# Patient Record
Sex: Female | Born: 1960
Health system: Southern US, Community
[De-identification: ages and names within clinical notes are randomized; demographics above are authoritative.]

## PROBLEM LIST (undated history)

## (undated) ENCOUNTER — Emergency Department (HOSPITAL_COMMUNITY): Discharge status: Against Medical Advice | Payer: Self-pay

## (undated) DIAGNOSIS — Z9289 Personal history of other medical treatment: Secondary | ICD-10-CM

## (undated) DIAGNOSIS — G43909 Migraine, unspecified, not intractable, without status migrainosus: Secondary | ICD-10-CM

## (undated) DIAGNOSIS — I10 Essential (primary) hypertension: Secondary | ICD-10-CM

## (undated) DIAGNOSIS — N390 Urinary tract infection, site not specified: Secondary | ICD-10-CM

## (undated) HISTORY — DX: Urinary tract infection, site not specified: N39.0

## (undated) HISTORY — DX: Essential (primary) hypertension: I10

## (undated) HISTORY — PX: NO PAST SURGERIES: SHX2092

## (undated) HISTORY — DX: Personal history of other medical treatment: Z92.89

## (undated) HISTORY — DX: Migraine, unspecified, not intractable, without status migrainosus: G43.909

---

## 2006-11-02 ENCOUNTER — Emergency Department: Payer: Self-pay | Admitting: Unknown Physician Specialty

## 2010-01-15 ENCOUNTER — Ambulatory Visit: Payer: Self-pay | Admitting: Unknown Physician Specialty

## 2010-12-02 DIAGNOSIS — N390 Urinary tract infection, site not specified: Secondary | ICD-10-CM

## 2010-12-02 HISTORY — DX: Urinary tract infection, site not specified: N39.0

## 2011-01-22 ENCOUNTER — Ambulatory Visit: Payer: Self-pay | Admitting: Unknown Physician Specialty

## 2011-01-22 ENCOUNTER — Ambulatory Visit: Payer: Self-pay | Admitting: Internal Medicine

## 2012-02-25 ENCOUNTER — Ambulatory Visit: Payer: Self-pay | Admitting: Internal Medicine

## 2013-02-22 DIAGNOSIS — Z9289 Personal history of other medical treatment: Secondary | ICD-10-CM

## 2013-02-22 HISTORY — DX: Personal history of other medical treatment: Z92.89

## 2014-04-26 DIAGNOSIS — Z9289 Personal history of other medical treatment: Secondary | ICD-10-CM

## 2014-04-26 HISTORY — DX: Personal history of other medical treatment: Z92.89

## 2015-06-28 ENCOUNTER — Ambulatory Visit
Admission: EM | Admit: 2015-06-28 | Discharge: 2015-06-28 | Disposition: A | Discharge status: Home / Self Care | Payer: BLUE CROSS/BLUE SHIELD | Attending: Family Medicine | Admitting: Family Medicine

## 2015-06-28 ENCOUNTER — Ambulatory Visit: Payer: BLUE CROSS/BLUE SHIELD

## 2015-06-28 DIAGNOSIS — J40 Bronchitis, not specified as acute or chronic: Secondary | ICD-10-CM

## 2015-06-28 DIAGNOSIS — R05 Cough: Secondary | ICD-10-CM | POA: Diagnosis present

## 2015-06-28 MED ORDER — AZITHROMYCIN 250 MG PO TABS: ORAL_TABLET | ORAL | Status: DC

## 2015-06-28 MED ORDER — ALBUTEROL SULFATE HFA 108 (90 BASE) MCG/ACT IN AERS: 1.0000 | INHALATION_SPRAY | Freq: Four times a day (QID) | RESPIRATORY_TRACT | Status: DC | PRN

## 2015-06-28 NOTE — ED Provider Notes (Signed)
SUBJECTIVE:  Diane Bond is a 54 y.o. female who complains of nasal drainage, mild sore throat, nonproductive cough for the last few days. Denies fever, CP, SOB, N/V/D, abdominal pain, severe headache. Has not tried any medication. Denies hx of smoking cigarettes.  Patient denies any history of respiratory or cardiac problems.    OBJECTIVE: She appears well, vital signs are as noted.  General: NAD HEENT: mild pharyngeal erythema, no exudate, no erythema of TMs, no cervical LAD Respiratory: mild expiratory wheezing, slight decreased breath sounds at bases, no accessory muscle use Cardiology: RRR Neurological: CN II-XII grossly intact   ASSESSMENT:  Bronchitis  PLAN: CXR shows no significant acute process/infiltrate, Xopenex treatment given in the office, Claritin prn, Delysm prn, Tylenol/Motrin prn, Z-pk, Albuterol MDI, rest, hydration, seek medical attention if symptoms persist or worsen.        Jolene Provost, MD 06/28/15 1046

## 2015-06-28 NOTE — ED Notes (Signed)
Started last Saturday with runny nose and slight cough. Now worsening cough and hard to get breath when lying on back. Non productive cough. Loss of appetite

## 2017-03-31 ENCOUNTER — Emergency Department
Admission: EM | Admit: 2017-03-31 | Discharge: 2017-03-31 | Disposition: A | Discharge status: Home / Self Care | Payer: BLUE CROSS/BLUE SHIELD | Attending: Emergency Medicine | Admitting: Emergency Medicine

## 2017-03-31 ENCOUNTER — Emergency Department: Payer: BLUE CROSS/BLUE SHIELD

## 2017-03-31 ENCOUNTER — Encounter: Payer: Self-pay | Admitting: Emergency Medicine

## 2017-03-31 DIAGNOSIS — Y999 Unspecified external cause status: Secondary | ICD-10-CM | POA: Insufficient documentation

## 2017-03-31 DIAGNOSIS — Z87891 Personal history of nicotine dependence: Secondary | ICD-10-CM | POA: Diagnosis not present

## 2017-03-31 DIAGNOSIS — M545 Low back pain: Secondary | ICD-10-CM | POA: Diagnosis not present

## 2017-03-31 DIAGNOSIS — M542 Cervicalgia: Secondary | ICD-10-CM | POA: Insufficient documentation

## 2017-03-31 DIAGNOSIS — Y92410 Unspecified street and highway as the place of occurrence of the external cause: Secondary | ICD-10-CM | POA: Diagnosis not present

## 2017-03-31 DIAGNOSIS — Z79899 Other long term (current) drug therapy: Secondary | ICD-10-CM | POA: Diagnosis not present

## 2017-03-31 DIAGNOSIS — Y939 Activity, unspecified: Secondary | ICD-10-CM | POA: Insufficient documentation

## 2017-03-31 DIAGNOSIS — S3992XA Unspecified injury of lower back, initial encounter: Secondary | ICD-10-CM | POA: Diagnosis present

## 2017-03-31 MED ORDER — ORPHENADRINE CITRATE 30 MG/ML IJ SOLN
60.0000 mg | Freq: Two times a day (BID) | INTRAMUSCULAR | Status: DC
  Administered 2017-03-31: 60 mg via INTRAMUSCULAR
  Filled 2017-03-31: qty 2

## 2017-03-31 MED ORDER — OXYCODONE-ACETAMINOPHEN 5-325 MG PO TABS: 1.0000 | ORAL_TABLET | Freq: Four times a day (QID) | ORAL | 0 refills | Status: AC | PRN

## 2017-03-31 MED ORDER — CYCLOBENZAPRINE HCL 5 MG PO TABS: 5.0000 mg | ORAL_TABLET | Freq: Three times a day (TID) | ORAL | 0 refills | Status: AC | PRN
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
## 2017-03-31 NOTE — ED Notes (Signed)
Lower back pain. Neck pain.  Denies numbness or tingling arms or legs. A/o.

## 2017-04-14 ENCOUNTER — Ambulatory Visit: Payer: Self-pay | Admitting: Obstetrics and Gynecology

## 2017-08-18 ENCOUNTER — Ambulatory Visit (INDEPENDENT_AMBULATORY_CARE_PROVIDER_SITE_OTHER): Payer: BLUE CROSS/BLUE SHIELD | Admitting: Obstetrics and Gynecology

## 2017-08-18 ENCOUNTER — Encounter: Payer: Self-pay | Admitting: Obstetrics and Gynecology

## 2017-08-18 VITALS — BP 110/70 | HR 65 | Ht 60.3 in | Wt 110.0 lb

## 2017-08-18 DIAGNOSIS — Z1231 Encounter for screening mammogram for malignant neoplasm of breast: Secondary | ICD-10-CM

## 2017-08-18 DIAGNOSIS — Z1211 Encounter for screening for malignant neoplasm of colon: Secondary | ICD-10-CM | POA: Diagnosis not present

## 2017-08-18 DIAGNOSIS — Z1151 Encounter for screening for human papillomavirus (HPV): Secondary | ICD-10-CM

## 2017-08-18 DIAGNOSIS — Z01419 Encounter for gynecological examination (general) (routine) without abnormal findings: Secondary | ICD-10-CM | POA: Diagnosis not present

## 2017-08-18 DIAGNOSIS — N951 Menopausal and female climacteric states: Secondary | ICD-10-CM

## 2017-08-18 DIAGNOSIS — Z124 Encounter for screening for malignant neoplasm of cervix: Secondary | ICD-10-CM | POA: Diagnosis not present

## 2017-08-18 DIAGNOSIS — Z1239 Encounter for other screening for malignant neoplasm of breast: Secondary | ICD-10-CM

## 2017-08-18 LAB — HEMOCCULT GUIAC POC 1CARD (OFFICE): FECAL OCCULT BLD: NEGATIVE

## 2017-08-18 MED ORDER — ESTRADIOL 0.1 MG/GM VA CREA: 1.0000 | TOPICAL_CREAM | Freq: Every day | VAGINAL | 1 refills | Status: DC

## 2017-08-18 NOTE — Progress Notes (Addendum)
PCP: System, Pcp Not In   Chief Complaint  Patient presents with  . Gynecologic Exam    HPI:      [name] is a 56 y.o. [phone] who LMP was No LMP recorded. Patient is postmenopausal., presents today for her annual examination.  Her menses are absent due to menopause.She does not have intermenstrual bleeding.  She does not have vasomotor sx.   Sex activity: single partner, contraception - post menopausal status. She does have vaginal dryness and has tried lubricants/coconut oil with some relief but still problematic.  Last Pap: February 22, 2013  Results were: no abnormalities /neg HPV DNA.  Hx of STDs: none  Last mammogram: May 22, 2015  Results were: normal--routine follow-up in 12 months There is a FH of breast cancer now in her mom, genetic testing not indicated. There is no FH of ovarian cancer. The patient does do self-breast exams.  Colonoscopy: never; doesn't want right now.  Tobacco use: The patient denies current or previous tobacco use. Alcohol use: none Exercise: moderately active  She does get adequate calcium and Vitamin D in her diet.  Labs with PCP.  Past Medical History:  Diagnosis Date  . Hypertension     History reviewed. No pertinent surgical history.  Family History  Problem Relation Age of Onset  . Diabetes Mother   . Breast cancer Mother 30  . Diabetes Father     Social History   Social History  . Marital status: Married    Spouse name: N/A  . Number of children: N/A  . Years of education: N/A   Occupational History  . Not on file.   Social History Main Topics  . Smoking status: Former Games developer  . Smokeless tobacco: Never Used  . Alcohol use Yes     Comment: socially  . Drug use: No  . Sexual activity: Yes    Partners: Male    Birth control/ protection: None   Other Topics Concern  . Not on file   Social History Narrative  . No narrative on file    No outpatient prescriptions have been marked as taking for the  08/18/17 encounter (Office Visit) with Copland, Alicia B, PA-C.      ROS:  Review of Systems  Constitutional: Positive for unexpected weight change. Negative for fatigue and fever.  Respiratory: Negative for cough, shortness of breath and wheezing.   Cardiovascular: Negative for chest pain, palpitations and leg swelling.  Gastrointestinal: Negative for blood in stool, constipation, diarrhea, nausea and vomiting.  Endocrine: Negative for cold intolerance, heat intolerance and polyuria.  Genitourinary: Positive for dyspareunia. Negative for dysuria, flank pain, frequency, genital sores, hematuria, menstrual problem, pelvic pain, urgency, vaginal bleeding, vaginal discharge and vaginal pain.  Musculoskeletal: Negative for back pain, joint swelling and myalgias.  Skin: Negative for rash.  Neurological: Negative for dizziness, syncope, light-headedness, numbness and headaches.  Hematological: Negative for adenopathy.  Psychiatric/Behavioral: Negative for agitation, confusion, sleep disturbance and suicidal ideas. The patient is not nervous/anxious.      Objective: BP 110/70   Pulse 65   Ht 5' 0.3" (1.532 m)   Wt 110 lb (49.9 kg)   BMI 21.27 kg/m    Physical Exam  Constitutional: She is oriented to person, place, and time. She appears well-developed and well-nourished.  Genitourinary: Rectum normal, vagina normal and uterus normal. There is no rash or tenderness on the right labia. There is no rash or tenderness on the left labia. No erythema or  tenderness in the vagina. No vaginal discharge found. Right adnexum does not display mass and does not display tenderness. Left adnexum does not display mass and does not display tenderness. Cervix does not exhibit motion tenderness or polyp. Uterus is not enlarged or tender. Rectal exam shows no fissure, no mass and guaiac negative stool.  Neck: Normal range of motion. No thyromegaly present.  Cardiovascular: Normal rate, regular rhythm and  normal heart sounds.   No murmur heard. Pulmonary/Chest: Effort normal and breath sounds normal. Right breast exhibits no mass, no nipple discharge, no skin change and no tenderness. Left breast exhibits no mass, no nipple discharge, no skin change and no tenderness.  Abdominal: Soft. There is no tenderness. There is no guarding.  Musculoskeletal: Normal range of motion.  Neurological: She is alert and oriented to person, place, and time. No cranial nerve deficit.  Psychiatric: She has a normal mood and affect. Her behavior is normal.  Vitals reviewed.   Results: Results for orders placed or performed in visit on 08/18/17 (from the past 24 hour(s))  POCT Occult Blood Stool     Status: Normal   Collection Time: 08/18/17  9:45 AM  Result Value Ref Range   Fecal Occult Blood, POC Negative Negative   Card #1 Date     Card #2 Fecal Occult Blod, POC     Card #2 Date     Card #3 Fecal Occult Blood, POC     Card #3 Date      Assessment/Plan:  Encounter for annual routine gynecological examination  Cervical cancer screening - Plan: IGP, Aptima HPV  Screening for HPV (human papillomavirus) - Plan: IGP, Aptima HPV  Screening for breast cancer - Pt to sched mammo. - Plan: MM DIGITAL SCREENING BILATERAL  Screening for colon cancer - Neg FOBT. Recommended scr colonoscopy due to age. Pt to consider and f/u for ref prn. - Plan: POCT Occult Blood Stool  Vaginal dryness, menopausal - Rx estrace crm. Cont lubricants. F/u prn. - Plan: estradiol (ESTRACE) 0.1 MG/GM vaginal cream          GYN counsel breast self exam, mammography screening, menopause, adequate intake of calcium and vitamin D    F/U  Return in about 1 year (around 08/18/2018).  Alicia B. Copland, PA-C 08/18/2017 9:47 AM

## 2017-08-18 NOTE — Patient Instructions (Signed)
[facility]: [phone]  [facility]: [phone] 

## 2017-08-20 LAB — IGP, APTIMA HPV
HPV Aptima: NEGATIVE
PAP Smear Comment: 0

## 2017-09-01 ENCOUNTER — Encounter: Payer: Self-pay | Admitting: Obstetrics and Gynecology

## 2017-09-03 ENCOUNTER — Encounter: Payer: Self-pay | Admitting: Obstetrics and Gynecology
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
## 2018-08-25 ENCOUNTER — Ambulatory Visit: Payer: BLUE CROSS/BLUE SHIELD | Admitting: Obstetrics and Gynecology

## 2018-09-15 ENCOUNTER — Encounter: Payer: Self-pay | Admitting: Obstetrics and Gynecology

## 2018-09-15 ENCOUNTER — Ambulatory Visit (INDEPENDENT_AMBULATORY_CARE_PROVIDER_SITE_OTHER): Payer: BLUE CROSS/BLUE SHIELD | Admitting: Obstetrics and Gynecology

## 2018-09-15 VITALS — BP 124/84 | HR 70 | Ht 63.5 in | Wt 112.0 lb

## 2018-09-15 DIAGNOSIS — Z800 Family history of malignant neoplasm of digestive organs: Secondary | ICD-10-CM | POA: Diagnosis not present

## 2018-09-15 DIAGNOSIS — Z01419 Encounter for gynecological examination (general) (routine) without abnormal findings: Secondary | ICD-10-CM | POA: Diagnosis not present

## 2018-09-15 DIAGNOSIS — Z1211 Encounter for screening for malignant neoplasm of colon: Secondary | ICD-10-CM | POA: Diagnosis not present

## 2018-09-15 DIAGNOSIS — Z1239 Encounter for other screening for malignant neoplasm of breast: Secondary | ICD-10-CM | POA: Diagnosis not present

## 2018-09-15 DIAGNOSIS — N941 Unspecified dyspareunia: Secondary | ICD-10-CM

## 2018-09-15 DIAGNOSIS — N951 Menopausal and female climacteric states: Secondary | ICD-10-CM

## 2018-09-15 MED ORDER — ESTRADIOL 0.1 MG/GM VA CREA: 1.0000 | TOPICAL_CREAM | Freq: Every day | VAGINAL | 1 refills | Status: DC

## 2018-09-15 NOTE — Patient Instructions (Signed)
I value your feedback and entrusting us with your care. If you get a [facility] patient survey, I would appreciate you taking the time to let us know about your experience today. Thank you!  [facility]: [phone]  [facility]: [phone]  

## 2018-09-15 NOTE — Progress Notes (Signed)
PCP: System, Pcp Not In   Chief Complaint  Patient presents with  . Gynecologic Exam    HPI:      [name] is a 57 y.o. [phone] who LMP was No LMP recorded. Patient is postmenopausal., presents today for her annual examination.  Her menses are absent due to menopause.She does not have intermenstrual bleeding.  She does not have vasomotor sx.   Sex activity: single partner, contraception - post menopausal status. She does have vaginal dryness and has tried lubricants/coconut oil with some relief. Started estrace crm last yr with some improvement. Wants to cont.  Last Pap: 08/18/17 Results were: no abnormalities /neg HPV DNA.  Hx of STDs: none  Last mammogram: 09/01/17  Results were: normal--routine follow-up in 12 months There is a FH of breast cancer now in her mom, genetic testing not indicated. There is no FH of ovarian cancer. Her brother was diagnosed this yr with stage 4 colon cancer and is "gene neg". The patient does do self-breast exams.  Colonoscopy: never; declined last yr. Is considering now due to FH colon cancer in her brother. Plans to sched through PCP.  Tobacco use: The patient denies current or previous tobacco use. Alcohol use: social Exercise: moderately active  She does get adequate calcium and Vitamin D in her diet.  Labs with PCP.  Past Medical History:  Diagnosis Date  . History of mammogram 04/26/2014   Birad 1  . History of Papanicolaou smear of cervix 02/22/2013   n/n  . Hypertension    Borderline  . Urinary tract infection 2012    History reviewed. No pertinent surgical history.  Family History  Problem Relation Age of Onset  . Diabetes Mother        DM type 2  . Breast cancer Mother 34  . Diabetes Father        DM type 2  . Other Maternal Grandmother        Uterine Adnexal Neoplasm, Malignant, 40's  . Diabetes Maternal Grandmother        DM Type 2  . Diabetes Paternal Grandmother        DM type 2  . Diabetes Other    DM type 2  . Colon cancer Brother 76       stage 4; "gene neg"    Social History   Socioeconomic History  . Marital status: Married    Spouse name: Not on file  . Number of children: Not on file  . Years of education: Not on file  . Highest education level: Not on file  Occupational History  . Not on file  Social Needs  . Financial resource strain: Not on file  . Food insecurity:    Worry: Not on file    Inability: Not on file  . Transportation needs:    Medical: Not on file    Non-medical: Not on file  Tobacco Use  . Smoking status: Former Games developer  . Smokeless tobacco: Never Used  Substance and Sexual Activity  . Alcohol use: Yes    Comment: socially  . Drug use: No  . Sexual activity: Yes    Partners: Male    Birth control/protection: Post-menopausal  Lifestyle  . Physical activity:    Days per week: Not on file    Minutes per session: Not on file  . Stress: Not on file  Relationships  . Social connections:    Talks on phone: Not on file    Gets  together: Not on file    Attends religious service: Not on file    Active member of club or organization: Not on file    Attends meetings of clubs or organizations: Not on file    Relationship status: Not on file  . Intimate partner violence:    Fear of current or ex partner: Not on file    Emotionally abused: Not on file    Physically abused: Not on file    Forced sexual activity: Not on file  Other Topics Concern  . Not on file  Social History Narrative  . Not on file    No outpatient medications have been marked as taking for the 09/15/18 encounter (Office Visit) with Irem Stoneham, Ilona Sorrel, PA-C.      ROS:  Review of Systems  Constitutional: Negative for fatigue, fever and unexpected weight change.  Respiratory: Negative for cough, shortness of breath and wheezing.   Cardiovascular: Negative for chest pain, palpitations and leg swelling.  Gastrointestinal: Negative for blood in stool, constipation, diarrhea,  nausea and vomiting.  Endocrine: Negative for cold intolerance, heat intolerance and polyuria.  Genitourinary: Negative for dyspareunia, dysuria, flank pain, frequency, genital sores, hematuria, menstrual problem, pelvic pain, urgency, vaginal bleeding, vaginal discharge and vaginal pain.  Musculoskeletal: Positive for arthralgias. Negative for back pain, joint swelling and myalgias.  Skin: Negative for rash.  Neurological: Negative for dizziness, syncope, light-headedness, numbness and headaches.  Hematological: Negative for adenopathy.  Psychiatric/Behavioral: Negative for agitation, confusion, sleep disturbance and suicidal ideas. The patient is not nervous/anxious.      Objective: BP 124/84   Pulse 70   Ht 5' 3.5" (1.613 m)   Wt 112 lb (50.8 kg)   BMI 19.53 kg/m    Physical Exam  Constitutional: She is oriented to person, place, and time. She appears well-developed and well-nourished.  Genitourinary: Rectum normal, vagina normal and uterus normal. There is no rash or tenderness on the right labia. There is no rash or tenderness on the left labia. No erythema or tenderness in the vagina. No vaginal discharge found. Right adnexum does not display mass and does not display tenderness. Left adnexum does not display mass and does not display tenderness. Cervix does not exhibit motion tenderness or polyp. Uterus is not enlarged or tender. Rectal exam shows no fissure, no mass and guaiac negative stool.  Neck: Normal range of motion. No thyromegaly present.  Cardiovascular: Normal rate, regular rhythm and normal heart sounds.  No murmur heard. Pulmonary/Chest: Effort normal and breath sounds normal. Right breast exhibits no mass, no nipple discharge, no skin change and no tenderness. Left breast exhibits no mass, no nipple discharge, no skin change and no tenderness.  Abdominal: Soft. There is no tenderness. There is no guarding.  Musculoskeletal: Normal range of motion.  Neurological: She  is alert and oriented to person, place, and time. No cranial nerve deficit.  Psychiatric: She has a normal mood and affect. Her behavior is normal.  Vitals reviewed.   Assessment/Plan:  Encounter for annual routine gynecological examination  Screening for breast cancer - Pt to sched mammo - Plan: MM 3D SCREEN BREAST BILATERAL  Screening for colon cancer - Recommended scr colonoscopy due to age/FH. Pt wants to sched through PCP.  Family history of colon cancer  Vaginal dryness, menopausal - Rx RF estrace. F/u prn.  - Plan: estradiol (ESTRACE) 0.1 MG/GM vaginal cream  Dyspareunia in female - Plan: estradiol (ESTRACE) 0.1 MG/GM vaginal cream  Meds ordered this encounter  Medications  .  estradiol (ESTRACE) 0.1 MG/GM vaginal cream    Sig: Place 1 Applicatorful vaginally at bedtime. Insert 1g nightly for 1 wk, then 1 g once weekly as maintenace    Dispense:  42.5 g    Refill:  1    Order Specific Question:   Supervising Provider    Answer:   Nadara Mustard [[id number]]           GYN counsel breast self exam, mammography screening, menopause, adequate intake of calcium and vitamin D    F/U  Return in about 1 year (around 09/16/2019).  Ravinder Lukehart B. Kawanna Christley, PA-C 09/15/2018 2:00 PM

## 2018-10-16 ENCOUNTER — Encounter: Payer: Self-pay | Admitting: Obstetrics and Gynecology

## 2018-10-19 ENCOUNTER — Encounter: Payer: Self-pay | Admitting: Obstetrics and Gynecology

## 2020-09-13 ENCOUNTER — Ambulatory Visit: Payer: BLUE CROSS/BLUE SHIELD | Admitting: Obstetrics and Gynecology

## 2020-10-31 ENCOUNTER — Encounter: Payer: Self-pay | Admitting: Obstetrics and Gynecology

## 2020-10-31 ENCOUNTER — Other Ambulatory Visit: Payer: Self-pay

## 2020-10-31 ENCOUNTER — Ambulatory Visit (INDEPENDENT_AMBULATORY_CARE_PROVIDER_SITE_OTHER): Payer: No Typology Code available for payment source | Admitting: Obstetrics and Gynecology

## 2020-10-31 VITALS — BP 140/90 | Ht 63.5 in | Wt 118.0 lb

## 2020-10-31 DIAGNOSIS — Z1231 Encounter for screening mammogram for malignant neoplasm of breast: Secondary | ICD-10-CM

## 2020-10-31 DIAGNOSIS — Z1211 Encounter for screening for malignant neoplasm of colon: Secondary | ICD-10-CM

## 2020-10-31 DIAGNOSIS — Z800 Family history of malignant neoplasm of digestive organs: Secondary | ICD-10-CM

## 2020-10-31 DIAGNOSIS — N941 Unspecified dyspareunia: Secondary | ICD-10-CM

## 2020-10-31 DIAGNOSIS — Z01419 Encounter for gynecological examination (general) (routine) without abnormal findings: Secondary | ICD-10-CM

## 2020-10-31 DIAGNOSIS — N951 Menopausal and female climacteric states: Secondary | ICD-10-CM

## 2020-10-31 MED ORDER — ESTRADIOL 0.1 MG/GM VA CREA: 1.0000 | TOPICAL_CREAM | Freq: Every day | VAGINAL | 1 refills | Status: DC

## 2020-10-31 NOTE — Patient Instructions (Addendum)
I value your feedback and entrusting us with your care. If you get a [place] patient survey, I would appreciate you taking the time to let us know about your experience today. Thank you!  As of November 11, 2019, your lab results will be released to your MyChart immediately, before I even have a chance to see them. Please give me time to review them and contact you if there are any abnormalities. Thank you for your patience.     [facility]: [phone]  

## 2020-10-31 NOTE — Progress Notes (Signed)
PCP: Pcp, No   Chief Complaint  Patient presents with  . Gynecologic Exam    HPI:      [name] is a 59 y.o. [phone] who LMP was No LMP recorded. Patient is postmenopausal., presents today for her annual examination.  Her menses are absent due to menopause.She does not have intermenstrual bleeding.  She does not have vasomotor sx.   Sex activity: single partner, contraception - post menopausal status. She does have vaginal dryness improved with estrace crm. Needs RF.  Last Pap: 08/18/17 Results were: no abnormalities /neg HPV DNA.  Hx of STDs: none  Last [facility]: 10/16/18 [facility]; Results were: normal--routine follow-up in 12 months There is a FH of breast cancer in her mom, genetic testing not indicated. There is no FH of ovarian cancer. Her brother had stage 4 colon cancer and is "gene neg". The patient does do self-breast exams.  Colonoscopy: never; declined in past; has referral through PCP.  Tobacco use: The patient denies current or previous tobacco use. Alcohol use: none No drug use Exercise: min active  She does get adequate calcium but not Vitamin D in her diet.  Labs with PCP.  Past Medical History:  Diagnosis Date  . History of mammogram 04/26/2014   Birad 1  . History of Papanicolaou smear of cervix 02/22/2013   n/n  . Hypertension    Borderline  . Urinary tract infection 2012    History reviewed. No pertinent surgical history.  Family History  Problem Relation Age of Onset  . Diabetes Mother        DM type 2  . Breast cancer Mother 55  . Diabetes Father        DM type 2  . Other Maternal Grandmother        Uterine Adnexal Neoplasm, Malignant, 40's  . Diabetes Maternal Grandmother        DM Type 2  . Diabetes Paternal Grandmother        DM type 2  . Diabetes Other        DM type 2  . Colon cancer Brother 41       stage 4; "gene neg"    Social History   Socioeconomic History  . Marital status: Married    Spouse name: Not on  file  . Number of children: Not on file  . Years of education: Not on file  . Highest education level: Not on file  Occupational History  . Not on file  Tobacco Use  . Smoking status: Former Games developer  . Smokeless tobacco: Never Used  Vaping Use  . Vaping Use: Never used  Substance and Sexual Activity  . Alcohol use: Yes    Comment: socially  . Drug use: No  . Sexual activity: Not Currently    Partners: Male    Birth control/protection: Post-menopausal  Other Topics Concern  . Not on file  Social History Narrative  . Not on file   Social Determinants of Health   Financial Resource Strain:   . Difficulty of Paying Living Expenses: Not on file  Food Insecurity:   . Worried About Programme researcher, broadcasting/film/video in the Last Year: Not on file  . Ran Out of Food in the Last Year: Not on file  Transportation Needs:   . Lack of Transportation (Medical): Not on file  . Lack of Transportation (Non-Medical): Not on file  Physical Activity:   . Days of Exercise per Week: Not on file  .  Minutes of Exercise per Session: Not on file  Stress:   . Feeling of Stress : Not on file  Social Connections:   . Frequency of Communication with Friends and Family: Not on file  . Frequency of Social Gatherings with Friends and Family: Not on file  . Attends Religious Services: Not on file  . Active Member of Clubs or Organizations: Not on file  . Attends Banker Meetings: Not on file  . Marital Status: Not on file  Intimate Partner Violence:   . Fear of Current or Ex-Partner: Not on file  . Emotionally Abused: Not on file  . Physically Abused: Not on file  . Sexually Abused: Not on file    No outpatient medications have been marked as taking for the 10/31/20 encounter (Office Visit) with Jaislyn Blinn, Ilona Sorrel, PA-C.      ROS:  Review of Systems  Constitutional: Negative for fatigue, fever and unexpected weight change.  Respiratory: Negative for cough, shortness of breath and wheezing.     Cardiovascular: Negative for chest pain, palpitations and leg swelling.  Gastrointestinal: Negative for blood in stool, constipation, diarrhea, nausea and vomiting.  Endocrine: Negative for cold intolerance, heat intolerance and polyuria.  Genitourinary: Negative for dyspareunia, dysuria, flank pain, frequency, genital sores, hematuria, menstrual problem, pelvic pain, urgency, vaginal bleeding, vaginal discharge and vaginal pain.  Musculoskeletal: Negative for arthralgias, back pain, joint swelling and myalgias.  Skin: Negative for rash.  Neurological: Negative for dizziness, syncope, light-headedness, numbness and headaches.  Hematological: Negative for adenopathy.  Psychiatric/Behavioral: Negative for agitation, confusion, sleep disturbance and suicidal ideas. The patient is not nervous/anxious.      Objective: BP 140/90   Ht 5' 3.5" (1.613 m)   Wt 118 lb (53.5 kg)   BMI 20.57 kg/m    Physical Exam Constitutional:      Appearance: She is well-developed.  Genitourinary:     Vulva, vagina, uterus, right adnexa, left adnexa and rectum normal.     No vulval lesion or tenderness noted.     No vaginal discharge, erythema or tenderness.     No cervical motion tenderness or polyp.     Uterus is not enlarged or tender.     No right or left adnexal mass present.     Right adnexa not tender.     Left adnexa not tender.  Rectum:     Guaiac result negative.     No rectal mass or anal fissure.  Neck:     Thyroid: No thyromegaly.  Cardiovascular:     Rate and Rhythm: Normal rate and regular rhythm.     Heart sounds: Normal heart sounds. No murmur heard.   Pulmonary:     Effort: Pulmonary effort is normal.     Breath sounds: Normal breath sounds.  Chest:     Breasts:        Right: No mass, nipple discharge, skin change or tenderness.        Left: No mass, nipple discharge, skin change or tenderness.  Abdominal:     Palpations: Abdomen is soft.     Tenderness: There is no  abdominal tenderness. There is no guarding.  Musculoskeletal:        General: Normal range of motion.     Cervical back: Normal range of motion.  Neurological:     General: No focal deficit present.     Mental Status: She is alert and oriented to person, place, and time.     Cranial Nerves:  No cranial nerve deficit.  Skin:    General: Skin is warm and dry.  Psychiatric:        Mood and Affect: Mood normal.        Behavior: Behavior normal.        Thought Content: Thought content normal.        Judgment: Judgment normal.  Vitals reviewed.     Assessment/Plan:  Encounter for annual routine gynecological examination  Encounter for screening mammogram for malignant neoplasm of breast - Plan: MM 3D SCREEN BREAST BILATERAL; pt to sched mammo  Family history of colon cancer--pt doesn't qualify for genetic testing; brother was gene neg  Screening for colon cancer--pt to do GI ref with PCP  Vaginal dryness, menopausal - Rx RF estrace. F/u prn.  - Plan: estradiol (ESTRACE) 0.1 MG/GM vaginal cream  Dyspareunia in female - Plan: estradiol (ESTRACE) 0.1 MG/GM vaginal cream  Meds ordered this encounter  Medications  . estradiol (ESTRACE) 0.1 MG/GM vaginal cream    Sig: Place 1 Applicatorful vaginally at bedtime. Insert 1g nightly for 1 wk, then 1 g once weekly as maintenace    Dispense:  42.5 g    Refill:  1    Order Specific Question:   Supervising Provider    Answer:   Nadara Mustard [[id number]]           GYN counsel breast self exam, mammography screening, menopause, adequate intake of calcium and vitamin D    F/U  Return in about 1 year (around 10/31/2021).  Raji Glinski B. Santiago Graf, PA-C 10/31/2020 4:24 PM

## 2021-11-21 ENCOUNTER — Encounter: Payer: Self-pay | Admitting: Obstetrics and Gynecology

## 2021-11-21 ENCOUNTER — Ambulatory Visit (INDEPENDENT_AMBULATORY_CARE_PROVIDER_SITE_OTHER): Payer: MEDICARE | Admitting: Obstetrics and Gynecology

## 2021-11-21 ENCOUNTER — Other Ambulatory Visit: Payer: Self-pay

## 2021-11-21 VITALS — BP 130/70 | Ht 63.5 in | Wt 113.0 lb

## 2021-11-21 DIAGNOSIS — Z01419 Encounter for gynecological examination (general) (routine) without abnormal findings: Secondary | ICD-10-CM | POA: Diagnosis not present

## 2021-11-21 DIAGNOSIS — Z1151 Encounter for screening for human papillomavirus (HPV): Secondary | ICD-10-CM | POA: Diagnosis not present

## 2021-11-21 DIAGNOSIS — N951 Menopausal and female climacteric states: Secondary | ICD-10-CM

## 2021-11-21 DIAGNOSIS — Z124 Encounter for screening for malignant neoplasm of cervix: Secondary | ICD-10-CM

## 2021-11-21 DIAGNOSIS — Z1231 Encounter for screening mammogram for malignant neoplasm of breast: Secondary | ICD-10-CM

## 2021-11-21 DIAGNOSIS — Z1211 Encounter for screening for malignant neoplasm of colon: Secondary | ICD-10-CM

## 2021-11-21 DIAGNOSIS — Z800 Family history of malignant neoplasm of digestive organs: Secondary | ICD-10-CM

## 2021-11-21 DIAGNOSIS — N941 Unspecified dyspareunia: Secondary | ICD-10-CM

## 2021-11-21 MED ORDER — ESTRADIOL 0.1 MG/GM VA CREA: TOPICAL_CREAM | VAGINAL | 1 refills | Status: DC

## 2021-11-21 NOTE — Patient Instructions (Signed)
I value your feedback and you entrusting us with your care. If you get a [place] patient survey, I would appreciate you taking the time to let us know about your experience today. Thank you!   [facility]: [phone]  

## 2021-11-21 NOTE — Progress Notes (Signed)
PCP: Pcp, No   Chief Complaint  Patient presents with   Gynecologic Exam    No concerns    HPI:      [name] is a 60 y.o. [id number] who LMP was No LMP recorded. Patient is postmenopausal., presents today for her annual examination.  Her menses are absent due to menopause.She does not have PMB.  She does not have vasomotor sx.   Sex activity: occas; single partner, contraception - post menopausal status. She does have vaginal dryness improved with estrace crm. Needs RF.  Last Pap: 08/18/17 Results were: no abnormalities /neg HPV DNA.  Hx of STDs: none  Last mammogram: 04/05/21 at [facility]; Results were: normal after addl imaging--routine follow-up in 12 months There is a FH of breast cancer in her mom. There is a FH of uterine/adnexal cancer in her MGM. Her brother had stage 4 colon cancer and is "gene neg". The patient does do self-breast exams. Pt has not had genetic testing done.  Colonoscopy: never; declined in past; may do referral through PCP. Wants [facility] GI provider. 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 Alcohol use: none No drug use Exercise: min active  She does get adequate calcium but not Vitamin D in her diet.  Labs with PCP.  Past Medical History:  Diagnosis Date   History of mammogram 04/26/2014   Birad 1   History of Papanicolaou smear of cervix 02/22/2013   n/n   Hypertension    Borderline   Urinary tract infection 2012    Past Surgical History:  Procedure Laterality Date   NO PAST SURGERIES      Family History  Problem Relation Age of Onset   Diabetes Mother        DM type 2   Breast cancer Mother 68   Diabetes Father        DM type 2   Colon cancer Brother 31       stage 4; "gene neg"   Other Maternal Grandmother        Uterine Adnexal Neoplasm, Malignant, 40's   Diabetes Maternal Grandmother        DM  Type 2   Diabetes Paternal Grandmother        DM type 2   Diabetes Other        DM type 2    Social History   Socioeconomic History   Marital status: Married    Spouse name: Not on file   Number of children: Not on file   Years of education: Not on file   Highest education level: Not on file  Occupational History   Not on file  Tobacco Use   Smoking status: Former   Smokeless tobacco: Never  Vaping Use   Vaping Use: Never used  Substance and Sexual Activity   Alcohol use: Yes    Comment: socially   Drug use: No   Sexual activity: Yes    Partners: Male    Birth control/protection: Post-menopausal  Other Topics Concern   Not on file  Social History Narrative   Not on file   Social Determinants of Health   Financial Resource Strain: Not on file  Food Insecurity: Not on file  Transportation Needs: Not on file  Physical Activity: Not on file  Stress: Not on file  Social Connections: Not on file  Intimate Partner Violence: Not on file    Current Meds  Medication Sig   [DISCONTINUED] estradiol (ESTRACE) 0.1 MG/GM vaginal cream Place 1 Applicatorful vaginally at bedtime. Insert 1g nightly for 1 wk, then 1 g once weekly as maintenace      ROS:  Review of Systems  Constitutional:  Negative for fatigue, fever and unexpected weight change.  Respiratory:  Negative for cough, shortness of breath and wheezing.   Cardiovascular:  Negative for chest pain, palpitations and leg swelling.  Gastrointestinal:  Negative for blood in stool, constipation, diarrhea, nausea and vomiting.  Endocrine: Negative for cold intolerance, heat intolerance and polyuria.  Genitourinary:  Negative for dyspareunia, dysuria, flank pain, frequency, genital sores, hematuria, menstrual problem, pelvic pain, urgency, vaginal bleeding, vaginal discharge and vaginal pain.  Musculoskeletal:  Negative for arthralgias, back pain, joint swelling and myalgias.  Skin:  Negative for rash.  Neurological:   Negative for dizziness, syncope, light-headedness, numbness and headaches.  Hematological:  Negative for adenopathy.  Psychiatric/Behavioral:  Positive for agitation and dysphoric mood. Negative for confusion, sleep disturbance and suicidal ideas. The patient is not nervous/anxious.     Objective: BP 130/70    Ht 5' 3.5" (1.613 m)    Wt 113 lb (51.3 kg)    BMI 19.70 kg/m    Physical Exam Constitutional:      Appearance: She is well-developed.  Genitourinary:     Vulva and rectum normal.     Right Labia: No rash, tenderness or lesions.    Left Labia: No tenderness, lesions or rash.    No vaginal discharge, erythema or tenderness.      Right Adnexa: not tender and no mass present.    Left Adnexa: not tender and no mass present.    No cervical motion tenderness, friability or polyp.     Uterus is not enlarged or tender.  Rectum:     Guaiac result negative.     No rectal mass or anal fissure.  Breasts:    Right: No mass, nipple discharge, skin change or tenderness.     Left: No mass, nipple discharge, skin change or tenderness.  Neck:     Thyroid: No thyromegaly.  Cardiovascular:     Rate and Rhythm: Normal rate and regular rhythm.     Heart sounds: Normal heart sounds. No murmur heard. Pulmonary:     Effort: Pulmonary effort is normal.     Breath sounds: Normal breath sounds.  Abdominal:     Palpations: Abdomen is soft.     Tenderness: There is no abdominal tenderness. There is no guarding or rebound.  Musculoskeletal:        General: Normal range of motion.     Cervical back: Normal range of motion.  Lymphadenopathy:     Cervical: No cervical adenopathy.  Neurological:     General: No focal deficit present.     Mental Status: She is alert and oriented to person, place, and time.     Cranial Nerves: No cranial nerve deficit.  Skin:    General: Skin is warm and dry.  Psychiatric:        Mood and Affect: Mood normal.        Behavior: Behavior normal.        Thought  Content: Thought content normal.        Judgment: Judgment normal.  Vitals reviewed.    Assessment/Plan:  Encounter for annual routine gynecological examination  Cervical cancer screening - Plan: Cytology - PAP  Screening  for HPV (human papillomavirus) - Plan: Cytology - PAP  Encounter for screening mammogram for malignant neoplasm of breast; pt to sheds at [facility]  Screening for colon cancer--pt to call for GI ref for [facility] provider  Family history of colon cancer--her brother is gene panel neg per pt. MyRisk testing offered to pt, will f/u if desires.   Vaginal dryness, menopausal - Plan: estradiol (ESTRACE) 0.1 MG/GM vaginal cream; Rx RF estrace crm. F/u prn  Dyspareunia in female - Plan: estradiol (ESTRACE) 0.1 MG/GM vaginal cream   Meds ordered this encounter  Medications   estradiol (ESTRACE) 0.1 MG/GM vaginal cream    Sig: Insert 1g once weekly as maintenace    Dispense:  42.5 g    Refill:  1    Order Specific Question:   Supervising Provider    Answer:   Nadara Mustard [[id number]]           GYN counsel breast self exam, mammography screening, menopause, adequate intake of calcium and vitamin D    F/U  Return in about 1 year (around 11/21/2022).  Kou Gucciardo B. Payden Bonus, PA-C 11/21/2021 4:36 PM

## 2021-11-30 LAB — CYTOLOGY - PAP: Adequacy: ABNORMAL

## 2024-01-09 ENCOUNTER — Other Ambulatory Visit
Admission: RE | Admit: 2024-01-09 | Discharge: 2024-01-09 | Disposition: A | Discharge status: Home / Self Care | Payer: BC Managed Care – PPO | Source: Ambulatory Visit | Attending: Ophthalmology | Admitting: Ophthalmology

## 2024-01-09 ENCOUNTER — Other Ambulatory Visit: Payer: Self-pay | Admitting: Ophthalmology

## 2024-01-09 ENCOUNTER — Encounter: Payer: Self-pay | Admitting: Ophthalmology

## 2024-01-09 DIAGNOSIS — H4710 Unspecified papilledema: Secondary | ICD-10-CM

## 2024-01-09 LAB — CBC
HCT: 36.7 % (ref 36.0–46.0)
Hemoglobin: 13.1 g/dL (ref 12.0–15.0)
MCH: 32.4 pg (ref 26.0–34.0)
MCHC: 35.7 g/dL (ref 30.0–36.0)
MCV: 90.8 fL (ref 80.0–100.0)
Platelets: 220 10*3/uL (ref 150–400)
RBC: 4.04 MIL/uL (ref 3.87–5.11)
RDW: 11.5 % (ref 11.5–15.5)
WBC: 6.9 10*3/uL (ref 4.0–10.5)
nRBC: 0 % (ref 0.0–0.2)

## 2024-01-09 LAB — C-REACTIVE PROTEIN: CRP: <0.5 mg/dL (ref <1.0)

## 2024-01-09 LAB — SEDIMENTATION RATE: Sed Rate: 16 mm/h (ref 0–30)

## 2024-01-15 ENCOUNTER — Ambulatory Visit: Admission: RE | Admit: 2024-01-15 | Payer: BC Managed Care – PPO | Source: Ambulatory Visit

## 2024-01-15 ENCOUNTER — Ambulatory Visit: Payer: BC Managed Care – PPO

## 2024-01-22 ENCOUNTER — Ambulatory Visit
Admission: RE | Admit: 2024-01-22 | Discharge: 2024-01-22 | Disposition: A | Discharge status: Home / Self Care | Payer: BC Managed Care – PPO | Source: Ambulatory Visit | Attending: Ophthalmology | Admitting: Ophthalmology

## 2024-01-22 DIAGNOSIS — H4710 Unspecified papilledema: Secondary | ICD-10-CM

## 2024-01-22 MED ORDER — GADOBUTROL 1 MMOL/ML IV SOLN
5.0000 mL | Freq: Once | INTRAVENOUS | Status: AC | PRN
  Administered 2024-01-22: 5 mL via INTRAVENOUS

## 2024-01-23 ENCOUNTER — Encounter: Payer: Self-pay | Admitting: Neurology

## 2024-01-30 NOTE — Progress Notes (Unsigned)
 Assessment/Plan:     ***Abnormal brain scan  -Discussed the findings on her brain scan.  It appears that perhaps the ophthalmologist thought that the small vessel disease was concerning for demyelinating disease because of the differential placed by the radiologist.  I am doubtful about demyelinating disease, and suspect that her T2 hyperintensities are likely from cerebral small vessel disease, especially given the age group.  Patient does have history of hypertension.  -Discussed with patient that a 4 mm meningioma in the anterior right frontal lobe is not something that we would do surgery on.  This is very small.  We can monitor this, but it is of doubtful clinical significance.  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  She was referred to rule out MS because of the radiologist differential on her MRI films, but it appears that she may have had some optic disc edema on the right on ophthalmology records, but again the notes are incredibly difficult to read.  If that is the case, she would likely need a lumbar puncture, although increased intracranial pressure generally causes bilateral papilledema.  I did not see any visual field testing done.  I will have my office call to get some clarity, but we will go ahead and schedule of a lumbar puncture with opening pressure. Subjective:   Diane Bond was seen today in neurologic consultation at the request of Ninetta Lights, Keri W, NP.  The consultation is for the evaluation of "possible MS."  Patient was seen by ophthalmology at [facility] for complaints of vision change.  She noted that she was seeing a line in the middle of her vision.  Ophthalmology/referral notes are handwritten and very difficult to read, but it appears that they saw optic disc edema on the right and then there is a note that I believe states that there was "subtle disc changes but symptomatic visual acuity  changes."  The patient is a 63 y.o. year old female with a history of ***.  The first symptom began *** years ago.   MRI brain with and without gadolinium and MRI orbits with and without gadolinium was completed January 22, 2024 in the emergency room.  There was evidence of a 4 mm probable meningioma in the anterior right frontal lobe.  There was moderate small vessel disease.  There was evidence of a chronic microhemorrhage in the right thalamus.  The MRI was nonacute.  PREVIOUS MEDICATIONS: ***  ALLERGIES:   Allergies  Allergen Reactions   Oxycodone Nausea And Vomiting   Sulfa Antibiotics Nausea And Vomiting    CURRENT MEDICATIONS:  Outpatient Encounter Medications as of 02/03/2024  Medication Sig   lisinopril (ZESTRIL) 5 MG tablet Take 1 tablet by mouth daily.   pantoprazole (PROTONIX) 20 MG tablet Take 1 tablet by mouth daily.   estradiol (ESTRACE) 0.1 MG/GM vaginal cream Insert 1g once weekly as maintenace   No facility-administered encounter medications on file as of 02/03/2024.    Objective:   PHYSICAL EXAMINATION:    VITALS:  There were no vitals filed for this visit.  GEN:  Normal appears female in no acute distress.  Appears stated age. HEENT:  Normocephalic, atraumatic. The mucous membranes are moist. The superficial temporal arteries are without ropiness or tenderness. Cardiovascular: Regular rate and rhythm. Lungs: Clear to auscultation bilaterally. Neck/Heme: There are no carotid bruits noted bilaterally.  NEUROLOGICAL: Orientation:  The patient is alert and oriented x 3.   Cranial  nerves: There is good facial symmetry.  Extraocular muscles are intact and visual fields are full to confrontational testing. Speech is fluent and clear. Soft palate rises symmetrically and there is no tongue deviation. Hearing is intact to conversational tone. Tone: Tone is good throughout. Sensation: Sensation is intact to light touch and pinprick throughout (facial, trunk,  extremities). Vibration is intact at the bilateral big toe. There is no extinction with double simultaneous stimulation. There is no sensory dermatomal level identified. Coordination:  The patient has no difficulty with RAM's or FNF bilaterally. Motor: Strength is 5/5 in the bilateral upper and lower extremities.  Shoulder shrug is equal and symmetric. There is no pronator drift.  There are no fasciculations noted. DTR's: Deep tendon reflexes are 2/4 at the bilateral biceps, triceps, brachioradialis, patella and achilles.  Plantar responses are downgoing bilaterally. Gait and Station: The patient is able to ambulate without difficulty. The patient is able to heel toe walk without any difficulty. The patient is able to ambulate in a tandem fashion. The patient is able to stand in the Romberg position.    Total time spent on today's visit was ***greater than 60 minutes, including both face-to-face time and nonface-to-face time.  Time included that spent on review of records (prior notes available to me/labs/imaging if pertinent), discussing treatment and goals, answering patient's questions and coordinating care.   Cc:  Noralyn Pick, NP

## 2024-02-02 ENCOUNTER — Telehealth: Payer: Self-pay

## 2024-02-02 NOTE — Telephone Encounter (Signed)
 Telephone call to [facility], [place] [address] [place], [place]  [address]  Phone: [phone] FAX:  [phone]    LMOVM to call the office back.  we cannot read their handwritten notes. did she have papilledema? did they do visual field testing and if so, can they send records of it.

## 2024-02-03 ENCOUNTER — Encounter: Payer: Self-pay | Admitting: Neurology

## 2024-02-03 ENCOUNTER — Ambulatory Visit (INDEPENDENT_AMBULATORY_CARE_PROVIDER_SITE_OTHER): Payer: BC Managed Care – PPO | Admitting: Neurology

## 2024-02-03 VITALS — BP 128/80 | HR 77 | Ht 63.0 in | Wt 112.6 lb

## 2024-02-03 DIAGNOSIS — H539 Unspecified visual disturbance: Secondary | ICD-10-CM

## 2024-02-03 DIAGNOSIS — R292 Abnormal reflex: Secondary | ICD-10-CM | POA: Diagnosis not present

## 2024-02-03 DIAGNOSIS — R9402 Abnormal brain scan: Secondary | ICD-10-CM | POA: Diagnosis not present

## 2024-02-03 DIAGNOSIS — H4710 Unspecified papilledema: Secondary | ICD-10-CM

## 2024-02-03 DIAGNOSIS — G379 Demyelinating disease of central nervous system, unspecified: Secondary | ICD-10-CM | POA: Diagnosis not present

## 2024-02-03 NOTE — Addendum Note (Signed)
 Addended by: Dimas Chyle on: 02/03/2024 11:03 AM   Modules accepted: Orders

## 2024-02-18 ENCOUNTER — Telehealth: Payer: Self-pay | Admitting: Neurology

## 2024-02-18 MED ORDER — DIAZEPAM 5 MG PO TABS: ORAL_TABLET | ORAL | 0 refills | Status: DC

## 2024-02-18 NOTE — Telephone Encounter (Signed)
 done

## 2024-02-18 NOTE — Telephone Encounter (Signed)
 Pt. Cld  call with date of MRI, so RX to be subscribe 02/27/24 to calm Pt during procedure

## 2024-02-18 NOTE — Discharge Instructions (Signed)

## 2024-02-19 ENCOUNTER — Ambulatory Visit
Admission: RE | Admit: 2024-02-19 | Discharge: 2024-02-19 | Disposition: A | Discharge status: Home / Self Care | Source: Ambulatory Visit | Attending: Neurology | Admitting: Neurology

## 2024-02-19 ENCOUNTER — Other Ambulatory Visit (HOSPITAL_COMMUNITY)
Admission: RE | Admit: 2024-02-19 | Discharge: 2024-02-19 | Disposition: A | Discharge status: Home / Self Care | Source: Ambulatory Visit | Attending: Neurology | Admitting: Neurology

## 2024-02-19 VITALS — BP 136/71 | HR 61

## 2024-02-19 DIAGNOSIS — G379 Demyelinating disease of central nervous system, unspecified: Secondary | ICD-10-CM | POA: Insufficient documentation

## 2024-02-19 NOTE — Progress Notes (Signed)
1 vial of blood drawn from pts Left AC to be sent off with LP lab work. 1 successful attempt, pt tolerated well. Gauze and tape applied after.

## 2024-02-23 LAB — CYTOLOGY - NON PAP

## 2024-02-24 ENCOUNTER — Encounter: Payer: Self-pay | Admitting: Neurology

## 2024-02-25 ENCOUNTER — Encounter: Payer: Self-pay | Admitting: Neurology

## 2024-02-27 ENCOUNTER — Ambulatory Visit
Admission: RE | Admit: 2024-02-27 | Discharge: 2024-02-27 | Disposition: A | Discharge status: Home / Self Care | Source: Ambulatory Visit | Attending: Neurology | Admitting: Neurology

## 2024-02-27 DIAGNOSIS — G379 Demyelinating disease of central nervous system, unspecified: Secondary | ICD-10-CM

## 2024-02-27 MED ORDER — GADOPICLENOL 0.5 MMOL/ML IV SOLN
7.5000 mL | Freq: Once | INTRAVENOUS | Status: AC | PRN
  Administered 2024-02-27: 7.5 mL via INTRAVENOUS

## 2024-02-29 LAB — TEST AUTHORIZATION

## 2024-02-29 LAB — CSF CELL COUNT WITH DIFFERENTIAL
RBC Count, CSF: 0 {cells}/uL
TOTAL NUCLEATED CELL: 1 {cells}/uL (ref 0–5)

## 2024-02-29 LAB — EXTRA SPECIMEN

## 2024-02-29 LAB — GLUCOSE, CSF: Glucose, CSF: 57 mg/dL (ref 40–80)

## 2024-02-29 LAB — MYELIN BASIC PROTEIN, CSF: Myelin Basic Protein: <2 ug/L (ref <=4.0)

## 2024-02-29 LAB — CSF CULTURE W GRAM STAIN
MICRO NUMBER:: 16226273
Result:: NO GROWTH
SPECIMEN QUALITY:: ADEQUATE

## 2024-02-29 LAB — CNS IGG SYNTHESIS RATE, CSF+BLOOD
Albumin Serum: 5.8 g/dL — ABNORMAL HIGH (ref 3.6–5.1)
Albumin, CSF: 14.2 mg/dL (ref 8.0–42.0)
CNS-IgG Synthesis Rate: -4.7 mg/(24.h) (ref <=3.3)
IgG (Immunoglobin G), Serum: 1080 mg/dL (ref 600–1540)
IgG Total CSF: 1.1 mg/dL (ref 0.8–7.7)
IgG-Index: 0.42 (ref <0.70)

## 2024-02-29 LAB — PROTEIN, CSF: Total Protein, CSF: 28 mg/dL (ref 15–60)

## 2024-03-24 ENCOUNTER — Telehealth: Payer: Self-pay | Admitting: Neurology

## 2024-03-24 NOTE — Telephone Encounter (Signed)
 Called [facility] and spoke to [place] the liaison for our office. [phone] she is looking into this and calling me back

## 2024-03-24 NOTE — Telephone Encounter (Signed)
 Can you call [facility] and find out why oligoclonal bands were never reported from the spinal fluid.  It still says needs to be collected, which is very concerning given that this was a spinal fluid collection

## 2024-03-26 NOTE — Telephone Encounter (Signed)
Updates

## 2024-03-26 NOTE — Telephone Encounter (Signed)
 I called [facility] again and she said they were trying to figure out what happened. I did tell her that this is incredibly dangerous to do another LP and this particular sample is needed to diagnose MS. She wanted to meet with just me and a manger from [facility] and I said no that Orelia Binet needs to be included in this and I also presented the research I had completed on this her email is being sent to you as well for reference

## 2024-04-20 NOTE — Progress Notes (Signed)
 Assessment/Plan:    Abnormal brain scan  -Discussed the findings on her brain scan.  The T2 hyperintensities on her brain scan are somewhat suspicious for demyelinating disease, although that would be a bit unusual in the age group to be initially diagnosed.  MRI cervical spine was unremarkable for demyelinating disease.  Lumbar puncture with a normal IgG index.  Unfortunately, [facility] lost the fluid that was to be tested for oligoclonal bands and it was never tested.  I discussed with her whether or not we should repeat this lumbar puncture, or just follow her clinically and rescan her with another MRI brain in the early fall.  She prefers to hold off for repeat LP unless things get worse clinically.  I think that is reasonable.  We will repeat the scan August at [facility].  -Discussed with patient that a 4 mm meningioma in the anterior right frontal lobe is not something that we would do surgery on.  This is very small and of doubtful clinical significance.  She was shown the images today.  Neurosurgery referral was offered and declined  2.  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  She was referred to rule out MS because of the radiologist differential on her MRI films, but it appears that she may have had some optic disc edema on the right on ophthalmology records.  We called to [facility] and got a voicemail twice, but was finally able to get a person on the third call and they confirmed that patient did have disc edema.  Lumbar puncture was performed and CSF opening pressure was well within normal limits at 130 mm of water.  This would certainly not be the cause of any optic disc edema and she should follow back up with ophthalmology if she does have optic disc edema and visual field change.  She reports that mostly her vision issues are if staring at the computer for long lengths of time and she has a new RX for glasses but hasn't gotten  those yet.  I am going to try to call  eye later today to see if I can speak with her physician (addendum:  I did try to call after the appointment but could not get anyone to answer the telephone)  3.  Cervical degenerative changes  - Patient with moderate central canal stenosis and multilevel severe neuroforaminal stenosis. She is fairly asymptomatic.  No evidence of demyelinating disease in the cord.  No need to refer to neurosx after discussion of lack of sx's. Subjective:   Diane Bond was seen today in follow up.  We are trying to rule out demyelinating disease given abnormalities on her MRI brain, despite this being quite unusual in the age group.  She was having vision change.  Her ophthalmology records also indicated possible optic disc edema.  She had an MRI cervical spine since last visit.  There was no evidence of demyelinating disease there, but there was evidence of multilevel severe neuroforaminal stenosis as well as moderate central canal stenosis at C4-C5 and C5-C6.  A lumbar puncture was done since last visit.  Opening pressure was normal at 130 mm of water.  CSF protein was normal.  IgG index was normal.  Myelin basic protein was normal.  Unfortunately, the collecting agency [facility]) apparently lost/threw out the fluid that was supposed to be tested for oligoclonal bands and that was not tested for.  She reports that vision  is "okay" and is really only problematic when she is only problematic when she is on the computer a lot.  She got new glasses but hasn't picked them up yet.     ALLERGIES:   Allergies  Allergen Reactions   Oxycodone  Nausea And Vomiting   Sulfa Antibiotics Nausea And Vomiting    CURRENT MEDICATIONS:  Outpatient Encounter Medications as of 04/22/2024  Medication Sig   diazepam  (VALIUM ) 5 MG tablet 1 tab 1 hour prior to procedure; may repeat 30 min prior as needed.  Must have a driver   estradiol  (ESTRACE ) 0.1 MG/GM vaginal cream Insert  1g once weekly as maintenace   lisinopril (ZESTRIL) 5 MG tablet Take 1 tablet by mouth daily.   pantoprazole (PROTONIX) 20 MG tablet Take 1 tablet by mouth daily.   No facility-administered encounter medications on file as of 04/22/2024.    Objective:   PHYSICAL EXAMINATION:    VITALS:   Vitals:   04/22/24 0842  BP: 124/78  Pulse: (!) 58  SpO2: 98%  Weight: 113 lb 11.2 oz (51.6 kg)     GEN:  Normal appears female in no acute distress.  Appears stated age. HEENT:  Normocephalic, atraumatic. The mucous membranes are moist. The superficial temporal arteries are without ropiness or tenderness.   NEUROLOGICAL: Orientation:  The patient is alert and oriented x 3.   Cranial nerves: There is good facial symmetry.  Extraocular muscles are intact and visual fields are full to confrontational testing. Speech is fluent and clear. Soft palate rises symmetrically and there is no tongue deviation. Hearing is intact to conversational tone. Tone: Tone is good throughout. Sensation: Sensation is intact to light touch throughout Coordination:  The patient has no difficulty with RAM's or FNF bilaterally. Motor: Strength is 5/5 in the bilateral upper and lower extremities.  Shoulder shrug is equal and symmetric.  DTR's: Deep tendon reflexes are 3/4 at the bilateral biceps, triceps, brachioradialis, patella and achilles.  Plantar responses are downgoing bilaterally.   Gait and Station: The patient is able to ambulate without difficulty (watched as she left the office)   Total time spent on today's visit was 30 minutes, including both face-to-face time and nonface-to-face time.  Time included that spent on review of records (prior notes available to me/labs/imaging if pertinent), discussing treatment and goals, answering patient's questions and coordinating care.   Cc:  Laurence Pons, NP

## 2024-04-22 ENCOUNTER — Ambulatory Visit (INDEPENDENT_AMBULATORY_CARE_PROVIDER_SITE_OTHER): Admitting: Neurology

## 2024-04-22 ENCOUNTER — Encounter: Payer: Self-pay | Admitting: Neurology

## 2024-04-22 VITALS — BP 124/78 | HR 58 | Wt 113.7 lb

## 2024-04-22 DIAGNOSIS — M4722 Other spondylosis with radiculopathy, cervical region: Secondary | ICD-10-CM | POA: Diagnosis not present

## 2024-04-22 DIAGNOSIS — H539 Unspecified visual disturbance: Secondary | ICD-10-CM | POA: Diagnosis not present

## 2024-04-22 DIAGNOSIS — R9402 Abnormal brain scan: Secondary | ICD-10-CM | POA: Diagnosis not present

## 2024-04-22 NOTE — Patient Instructions (Signed)
 Good to see you!  We will do your repeat MRI brain in August and I will see you in September.  The physicians and staff at [facility] are committed to providing excellent care. You may receive a survey requesting feedback about your experience at our office. We strive to receive "very good" responses to the survey questions. If you feel that your experience would prevent you from giving the office a "very good " response, please contact our office to try to remedy the situation. We may be reached at [phone]. Thank you for taking the time out of your busy day to complete the survey.

## 2024-07-09 ENCOUNTER — Telehealth: Payer: Self-pay | Admitting: Neurology

## 2024-07-09 NOTE — Telephone Encounter (Signed)
 Let pt know that its time for repeat mri with and without at [facility].  Order if agreeable

## 2024-07-09 NOTE — Telephone Encounter (Signed)
 Called and left message.

## 2024-07-09 NOTE — Telephone Encounter (Signed)
-----   Message from Section Diane Bond sent at 04/22/2024  9:01 AM EDT ----- Order her MRI brain (repeat) for demyelinating ds

## 2024-07-14 NOTE — Telephone Encounter (Signed)
 Called and left patient message.

## 2024-07-15 ENCOUNTER — Other Ambulatory Visit: Payer: Self-pay

## 2024-07-15 DIAGNOSIS — R9402 Abnormal brain scan: Secondary | ICD-10-CM

## 2024-07-15 DIAGNOSIS — G379 Demyelinating disease of central nervous system, unspecified: Secondary | ICD-10-CM

## 2024-07-15 NOTE — Telephone Encounter (Signed)
 Pt. Calling [facility]. [facility] bk, xfrd to [facility]

## 2024-07-15 NOTE — Telephone Encounter (Signed)
 Spoke to patient and ordered the MRI and sent to [facility]

## 2024-07-21 ENCOUNTER — Encounter: Payer: Self-pay | Admitting: Neurology

## 2024-07-28 ENCOUNTER — Ambulatory Visit
Admission: RE | Admit: 2024-07-28 | Discharge: 2024-07-28 | Disposition: A | Discharge status: Home / Self Care | Source: Ambulatory Visit | Attending: Neurology | Admitting: Neurology

## 2024-07-28 DIAGNOSIS — R9402 Abnormal brain scan: Secondary | ICD-10-CM

## 2024-07-28 DIAGNOSIS — G379 Demyelinating disease of central nervous system, unspecified: Secondary | ICD-10-CM

## 2024-07-28 MED ORDER — GADOPICLENOL 0.5 MMOL/ML IV SOLN
5.0000 mL | Freq: Once | INTRAVENOUS | Status: AC | PRN
  Administered 2024-07-28: 5 mL via INTRAVENOUS

## 2024-08-10 NOTE — Progress Notes (Unsigned)
 Assessment/Plan:    Abnormal brain scan  -Discussed the findings on her brain scan.  The T2 hyperintensities on her brain scan are somewhat suspicious for demyelinating disease, although that would be a bit unusual in the age group to be initially diagnosed.  MRI cervical spine was unremarkable for demyelinating disease.  Lumbar puncture with a normal IgG index.  Unfortunately, [facility] lost the fluid that was to be tested for oligoclonal bands and it was never tested.  I discussed with her whether or not we should repeat this lumbar puncture, or just follow her clinically and she prefers to hold off for repeat LP unless things get worse clinically.  I think that is reasonable.  Thus far, her repeat scan has not shown new enhancing lesions but has shown one more lesion c/t prior visit.  I think that we should just follow that  2.  Meningioma, small, incidental  -Discussed with patient that a 4 mm meningioma in the anterior right frontal lobe is not something that we would do surgery on.  This is very small and of doubtful clinical significance.  Declines neurosx eval  3.  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  She was referred to rule out MS because of the radiologist differential on her MRI films, but it appears that she may have had some optic disc edema on the right on ophthalmology records.  We called to [facility] and got a voicemail twice, but was finally able to get a person on the third call and they confirmed that patient did have disc edema.  Lumbar puncture was performed and CSF opening pressure was well within normal limits at 130 mm of water.  This would certainly not be the cause of any optic disc edema and she should follow back up with ophthalmology if she does have optic disc edema and visual field change.  She reports that mostly her vision issues are if staring at the computer for long lengths of time and she has a new RX  for glasses but hasn't gotten those yet.  I am going to try to call [facility] later today to see if I can speak with her physician (addendum:  I did try to call after the appointment but could not get anyone to answer the telephone)  3.  Cervical degenerative changes  - Patient with moderate central canal stenosis and multilevel severe neuroforaminal stenosis. She is fairly asymptomatic.  No evidence of demyelinating disease in the cord.  No need to refer to neurosx after discussion of lack of sx's. Subjective:   Diane Bond was seen today in follow up.  Repeat MRI brain demonstrated no new enhancing lesions and perhaps one more lesion compared to the prior scan.  No diplopia.  No lateralizing weakness/paresthesias.  No falls.     ALLERGIES:   Allergies  Allergen Reactions   Oxycodone  Nausea And Vomiting   Sulfa Antibiotics Nausea And Vomiting    CURRENT MEDICATIONS:  Outpatient Encounter Medications as of 08/12/2024  Medication Sig   diazepam  (VALIUM ) 5 MG tablet 1 tab 1 hour prior to procedure; may repeat 30 min prior as needed.  Must have a driver   estradiol  (ESTRACE ) 0.1 MG/GM vaginal cream Insert 1g once weekly as maintenace   lisinopril (ZESTRIL) 5 MG tablet Take 1 tablet by mouth daily.   pantoprazole (PROTONIX) 20 MG tablet Take 1 tablet by mouth daily.   No facility-administered encounter medications  on file as of 08/12/2024.    Objective:   PHYSICAL EXAMINATION:    VITALS:   There were no vitals filed for this visit.    GEN:  Normal appears female in no acute distress.  Appears stated age. HEENT:  Normocephalic, atraumatic. The mucous membranes are moist. The superficial temporal arteries are without ropiness or tenderness.   NEUROLOGICAL: Orientation:  The patient is alert and oriented x 3.   Cranial nerves: There is good facial symmetry.  Extraocular muscles are intact and visual fields are full to confrontational testing. Speech is fluent and clear. Soft  palate rises symmetrically and there is no tongue deviation. Hearing is intact to conversational tone. Tone: Tone is good throughout. Sensation: Sensation is intact to light touch throughout Coordination:  The patient has no difficulty with RAM's or FNF bilaterally. Motor: Strength is 5/5 in the bilateral upper and lower extremities.  Shoulder shrug is equal and symmetric.  DTR's: Deep tendon reflexes are 3/4 at the bilateral biceps, triceps, brachioradialis, patella and achilles.  Plantar responses are downgoing bilaterally.   Gait and Station: The patient is able to ambulate without difficulty (watched as she left the office)   Total time spent on today's visit was *** minutes, including both face-to-face time and nonface-to-face time.  Time included that spent on review of records (prior notes available to me/labs/imaging if pertinent), discussing treatment and goals, answering patient's questions and coordinating care.   Cc:  Geralene Levorn ORN, NP

## 2024-08-12 ENCOUNTER — Ambulatory Visit (INDEPENDENT_AMBULATORY_CARE_PROVIDER_SITE_OTHER): Admitting: Neurology

## 2024-08-12 VITALS — BP 134/72 | HR 70 | Wt 113.2 lb

## 2024-08-12 DIAGNOSIS — H539 Unspecified visual disturbance: Secondary | ICD-10-CM | POA: Diagnosis not present

## 2024-08-12 DIAGNOSIS — R9402 Abnormal brain scan: Secondary | ICD-10-CM | POA: Diagnosis not present

## 2024-08-12 DIAGNOSIS — G379 Demyelinating disease of central nervous system, unspecified: Secondary | ICD-10-CM | POA: Diagnosis not present

## 2024-09-07 NOTE — Progress Notes (Unsigned)
 [facility]  PATIENT: Diane Bond DOB: 26-Aug-1961  REFERRING DOCTOR OR PCP:  Asberry Schneider, DO; [facility], NP SOURCE: Patient, notes from Dr. Doris, imaging and lab reports, MRI images personally reviewed.  _________________________________   HISTORICAL  CHIEF COMPLAINT:  Chief Complaint  Patient presents with   New Patient (Initial Visit)    Rm11, husband present,  Internal referral for second opinion - Demyelinating disease: vision test completed. Pt reports vision issues and a line of disturbance    HISTORY OF PRESENT ILLNESS:  I had the pleasure seeing the patient, Diane Bond, at [facility] for neurologic consultation regarding her neurologic symptoms and abnormal brain MRI.  She is a 63 year old woman who was noting difficulyt with eyesight last year.   This worsened as the day went on and she would sometime see a horizontal line OD.  Vision seemed more blurry below the line.  She saw ophthalmology and was felt to have a peripheral vision issue and an MRI was ordered 01/22/2024 of the brain and orbits.   She had more white matter change than is typical for age and was referred to Dr. Schneider.   She had an LP and IgG index was normal.  Unfortunately, OCB was lost.     She used to have a lot of migraine associated with nausea/vomiting, photophobia and phonophobia.  Moving worsened the HA.  This was occurring while she was going through menopause and had increased strsss (cancer diagnosis in husband and another relative).    Vascular risk factors:  HTN (including, vaping in past for just a few years, second hand tobacco, borderline diabetic.  Sehas has some palpitation but not AFib/CAD .       Imaging: MRI of the brain 07/28/2024 showed multiple T2/FLAIR hyperintense foci predominantly in the deep white matter with some foci in the subcortical white matter and a couple small foci in the periventricular white matter.  Compared to the MRI from  01/22/2024, there was 1 small focus in the left frontal lobe near the vertex - non-enhancing but slightly hyperintense on DMI.  Otherwise, the the pattern was the same.  Also very small 4 mm meningioma right frontal.   MRI of the cervical spine 02/27/2024 showed a normal spinal cord.  She had multilevel degenerative changes with moderate spinal stenosis at C4-C5 and C5-C6 and significant foraminal narrowing to the right at C4-C5, and bilaterally C5-C6 and towards the right at C6-C7.  No acute findings.  Normal enhancement pattern.   Laboratory: CSF 02/19/2024 showed a normal IgG index and basic protein.  Oligoclonal bands performed  REVIEW OF SYSTEMS: Constitutional: No fevers, chills, sweats, or change in appetite Eyes: No visual changes, double vision, eye pain Ear, nose and throat: No hearing loss, ear pain, nasal congestion, sore throat Cardiovascular: No chest pain, palpitations Respiratory:  No shortness of breath at rest or with exertion.   No wheezes GastrointestinaI: No nausea, vomiting, diarrhea, abdominal pain, fecal incontinence Genitourinary:  No dysuria, urinary retention or frequency.  No nocturia. Musculoskeletal:  No neck pain, back pain Integumentary: No rash, pruritus, skin lesions Neurological: as above Psychiatric: No depression at this time.  No anxiety Endocrine: No palpitations, diaphoresis, change in appetite, change in weigh or increased thirst Hematologic/Lymphatic:  No anemia, purpura, petechiae. Allergic/Immunologic: No itchy/runny eyes, nasal congestion, recent allergic reactions, rashes  ALLERGIES: Allergies  Allergen Reactions   Oxycodone  Nausea And Vomiting   Sulfa Antibiotics Nausea And Vomiting    HOME MEDICATIONS:  Current Outpatient Medications:    lisinopril (ZESTRIL) 5 MG tablet, Take 1 tablet by mouth daily., Disp: , Rfl:   PAST MEDICAL HISTORY: Past Medical History:  Diagnosis Date   History of mammogram 04/26/2014   Birad 1   History of  Papanicolaou smear of cervix 02/22/2013   n/n   Hypertension    Borderline   Migraine    Urinary tract infection 2012    PAST SURGICAL HISTORY: Past Surgical History:  Procedure Laterality Date   NO PAST SURGERIES      FAMILY HISTORY: Family History  Problem Relation Age of Onset   Diabetes Mother        DM type 2   Breast cancer Mother 51   Dementia Mother    Diabetes Father        DM type 2   Colon cancer Brother 87       stage 4; gene neg   Other Maternal Grandmother        Uterine Adnexal Neoplasm, Malignant, 40's   Diabetes Maternal Grandmother        DM Type 2   Diabetes Paternal Grandmother        DM type 2   Diabetes Other        DM type 2   Hashimoto's thyroiditis Child     SOCIAL HISTORY: Social History   Socioeconomic History   Marital status: Married    Spouse name: Not on file   Number of children: Not on file   Years of education: Not on file   Highest education level: Not on file  Occupational History   Occupation: part time    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 Right    Are you currently employed ? 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 No   Who lives with you? Spouse           Social Drivers of Corporate investment banker Strain: Low Risk  (10/07/2022)   [facility]   Overall Financial Resource Strain (CARDIA)    Difficulty of Paying Living Expenses: Not hard at all  Food Insecurity: No Food Insecurity (02/16/2024)   Received  from [facility]   Hunger Vital Sign    Within the past 12 months, you worried that your food would run out before you got the money to buy more.: Never true    Within the past 12 months, the food you bought just didn't last and you didn't have money to get more.: Never true  Transportation Needs: No Transportation Needs (02/16/2024)   Received from [facility]   PRAPARE - Transportation    Lack of Transportation (Medical): No    Lack of Transportation (Non-Medical): No  Physical Activity: Not on file  Stress: Not on file  Social Connections: Not on file  Intimate Partner Violence: Not At Risk (07/21/2023)  Received from [facility]   Humiliation, Afraid, Rape, and Kick questionnaire    Within the last year, have you been afraid of your partner or ex-partner?: No    Within the last year, have you been humiliated or emotionally abused in other ways by your partner or ex-partner?: No    Within the last year, have you been kicked, hit, slapped, or otherwise physically hurt by your partner or ex-partner?: No    Within the last year, have you been raped or forced to have any kind of sexual activity by your partner or ex-partner?: No       PHYSICAL EXAM  Vitals:   09/08/24 0838 09/08/24 0843  BP: (!) 143/78 (!) 145/77  Pulse: 66 68  SpO2:  96%  Weight: 112 lb 8 oz (51 kg)   Height: 5' 3 (1.6 m)     Body mass index is 19.93 kg/m.   Vision Screening   Right eye Left eye Both eyes  Without correction     With correction 20/50 20/20 20/70    Mild color asymmetry, brighter OS  General: The patient is well-developed and well-nourished and in no acute distress  HEENT:  Head is [place]/AT.  Sclera are anicteric.  Funduscopic exam shows normal optic discs and retinal vessels.  I did not note any edema.   Neck: No carotid bruits are noted.  The neck is nontender.  Cardiovascular: The heart has a regular rate and rhythm with a normal S1 and S2. There were no murmurs, gallops  or rubs.    Skin: Extremities are without rash or  edema.  Musculoskeletal:  Back is nontender  Neurologic Exam  Mental status: The patient is alert and oriented x 3 at the time of the examination. The patient has apparent normal recent and remote memory, with an apparently normal attention span and concentration ability.   Speech is normal.  Cranial nerves: Extraocular movements are full. Pupils are equal, round, and reactive to light and accomodation.  Visual fields are full.  Facial symmetry is present. There is good facial sensation to soft touch bilaterally.Facial strength is normal.  Trapezius and sternocleidomastoid strength is normal. No dysarthria is noted.  The tongue is midline, and the patient has symmetric elevation of the soft palate. No obvious hearing deficits are noted.  Motor:  Muscle bulk is normal.   Tone is normal. Strength is  5 / 5 in all 4 extremities.   Sensory: Sensory testing is intact to pinprick, soft touch and vibration sensation in all 4 extremities.  Coordination: Cerebellar testing reveals good finger-nose-finger and heel-to-shin bilaterally.  Gait and station: Station is normal.   Gait is normal. Tandem gait is normal. Romberg is negative.   Reflexes: Deep tendon reflexes are symmetric and 3 in arms and 3+ in legs but no clonus or crossed adductors.   Plantar responses are flexor.    DIAGNOSTIC DATA (LABS, IMAGING, TESTING) - I reviewed patient records, labs, notes, testing and imaging myself where available.  Lab Results  Component Value Date   WBC 6.9 01/09/2024   HGB 13.1 01/09/2024   HCT 36.7 01/09/2024   MCV 90.8 01/09/2024   PLT 220 01/09/2024      Component Value Date/Time   ALBUMIN 5.8 (H) 02/19/2024 1425      ASSESSMENT AND PLAN  Abnormal brain scan  Vision changes  Hyperreflexia  Osteoarthritis of spine with radiculopathy, cervical region  Essential hypertension    In summary, Diane Bond is a 63 year old woman  who  who had noticed visual changes earlier this year and was found to have mild optic disc edema and altered visual field testing on eye exam.  MRI of the brain had shown white matter foci, more than typical for age.  Lumbar puncture did not show changes typical of MS though the oligoclonal band fluid was lost though the IgG index was performed (which was normal).  A follow-up MRI had actually shown 1 more T2/FLAIR hyperintense focus near the vertex on the left.  I reviewed the MRIs of the brain and her presence.  The appearance is much more typical of chronic microvascular ischemic change rather than demyelination.  The new focus did not enhance though was subtle on diffusion weighted imaging, more consistent with ischemia than demyelination.  I discussed with her and her husband that I feel the likelihood that she has a MS is about 10%.  More likely the combination of risk factors such as hypertension (which was poorly controlled until recently), history of vaping and migraines possibly playing a role in causing chronic microvascular ischemic changes.  BP is a little bit elevated today though has generally done well on 5 mg of lisinopril.  Because I cannot completely rule multiple sclerosis out, I would like to recheck an MRI again in about a year.  We will reevaluate her around that time as well.  I do not have a great explanation for the visual changes but she does feel they are improved. She will discuss her blood pressure at her next primary care appointment.  Ideally she will be able to get this under tighter control.  She will also follow-up with ophthalmology I will see her back in 1 year but she is advised to call sooner if she has significant new or worsening neurologic symptoms.    Diane Bond A. Vear, MD, [facility] 09/08/2024, 9:56 AM Certified in Neurology, Clinical Neurophysiology, Sleep Medicine and Neuroimaging  [facility] [address], [address] [place], [place] [address] [phone]

## 2024-09-08 ENCOUNTER — Ambulatory Visit (INDEPENDENT_AMBULATORY_CARE_PROVIDER_SITE_OTHER): Admitting: Neurology

## 2024-09-08 ENCOUNTER — Encounter: Payer: Self-pay | Admitting: Neurology

## 2024-09-08 VITALS — BP 145/77 | HR 68 | Ht 63.0 in | Wt 112.5 lb

## 2024-09-08 DIAGNOSIS — R9402 Abnormal brain scan: Secondary | ICD-10-CM | POA: Diagnosis not present

## 2024-09-08 DIAGNOSIS — H539 Unspecified visual disturbance: Secondary | ICD-10-CM

## 2024-09-08 DIAGNOSIS — R292 Abnormal reflex: Secondary | ICD-10-CM

## 2024-09-08 DIAGNOSIS — M4722 Other spondylosis with radiculopathy, cervical region: Secondary | ICD-10-CM | POA: Diagnosis not present

## 2024-09-08 DIAGNOSIS — I10 Essential (primary) hypertension: Secondary | ICD-10-CM

## 2025-02-15 ENCOUNTER — Ambulatory Visit: Admitting: Neurology

## 2025-09-08 ENCOUNTER — Ambulatory Visit: Admitting: Neurology
